# Patient Record
Sex: Female | Born: 1980 | Race: Black or African American | Hispanic: No | Marital: Single | Smoking: Never smoker
Health system: Southern US, Community
[De-identification: ages and names within clinical notes are randomized; demographics above are authoritative.]

## PROBLEM LIST (undated history)

## (undated) DIAGNOSIS — J45909 Unspecified asthma, uncomplicated: Secondary | ICD-10-CM

## (undated) DIAGNOSIS — Z8249 Family history of ischemic heart disease and other diseases of the circulatory system: Secondary | ICD-10-CM

## (undated) DIAGNOSIS — D649 Anemia, unspecified: Secondary | ICD-10-CM

## (undated) DIAGNOSIS — Z833 Family history of diabetes mellitus: Secondary | ICD-10-CM

## (undated) DIAGNOSIS — Z87898 Personal history of other specified conditions: Secondary | ICD-10-CM

## (undated) HISTORY — PX: NO PAST SURGERIES: SHX2092

## (undated) HISTORY — DX: Family history of diabetes mellitus: Z83.3

## (undated) HISTORY — DX: Personal history of other specified conditions: Z87.898

## (undated) HISTORY — DX: Family history of ischemic heart disease and other diseases of the circulatory system: Z82.49

## (undated) HISTORY — DX: Unspecified asthma, uncomplicated: J45.909

---

## 2004-04-12 ENCOUNTER — Emergency Department (HOSPITAL_COMMUNITY): Admission: EM | Admit: 2004-04-12 | Discharge: 2004-04-12 | Payer: Self-pay | Admitting: Emergency Medicine

## 2012-01-26 HISTORY — PX: TRANSTHORACIC ECHOCARDIOGRAM: SHX275

## 2013-03-13 ENCOUNTER — Encounter: Payer: Self-pay | Admitting: Internal Medicine

## 2013-03-13 ENCOUNTER — Encounter: Payer: Self-pay | Admitting: *Deleted

## 2013-03-17 ENCOUNTER — Ambulatory Visit: Payer: Self-pay | Admitting: Internal Medicine

## 2013-03-31 ENCOUNTER — Ambulatory Visit (INDEPENDENT_AMBULATORY_CARE_PROVIDER_SITE_OTHER): Payer: BC Managed Care – PPO | Admitting: Internal Medicine

## 2013-03-31 ENCOUNTER — Encounter: Payer: Self-pay | Admitting: Internal Medicine

## 2013-03-31 VITALS — BP 120/60 | HR 74 | Ht 63.0 in | Wt 114.9 lb

## 2013-03-31 DIAGNOSIS — I493 Ventricular premature depolarization: Secondary | ICD-10-CM | POA: Insufficient documentation

## 2013-03-31 DIAGNOSIS — I4949 Other premature depolarization: Secondary | ICD-10-CM

## 2013-03-31 DIAGNOSIS — R002 Palpitations: Secondary | ICD-10-CM | POA: Insufficient documentation

## 2013-03-31 NOTE — Progress Notes (Signed)
OFFICE NOTE  Chief Complaint:  No complaints  Primary Care Physician: No PCP Per Patient  HPI:  Paige Barber a pleasant 32 year old female who was previously followed by Dr. Alanda Amass. She's here today to establish cardiac care with me. Her past medical history significant for palpitations at which time she was having PVCs. She reports she has had no further palpitations at about 2 years. She followed up with Dr. Alanda Amass periodically and he performed an echocardiogram in 2013. This showed normal systolic and diastolic function with mild left atrial enlargement and no significant valvular abnormalities. There was no significant valvular prolapse. She continues to be physically active and goes to the gym several times a week. She is not a caffeine user. She does have occasional seasonal allergies and was counseled about not using any antihistamines with decongestants.  PMHx:  Past Medical History  Diagnosis Date  . History of palpitations   . Asthma   . Family history of hypertension   . Family history of diabetes mellitus (DM)     Past Surgical History  Procedure Laterality Date  . Transthoracic echocardiogram  01/2012    EF=>55%; LA mildly dilated; mitral valve leaflets thickened, borderline MVP, mild MR; trace TR with normal RVSP; AV mildly sclerotic    FAMHx:  Family History  Problem Relation Age of Onset  . Diabetes Father     SOCHx:   reports that she has never smoked. She has never used smokeless tobacco. She reports that she drinks alcohol. Her drug history is not on file.  ALLERGIES:  No Known Allergies  ROS: A comprehensive review of systems was negative.  HOME MEDS: Current Outpatient Prescriptions  Medication Sig Dispense Refill  . Acetaminophen (TYLENOL PO) Take by mouth as needed.      Marland Kitchen albuterol (PROVENTIL HFA;VENTOLIN HFA) 108 (90 BASE) MCG/ACT inhaler Inhale 1 puff into the lungs every 6 (six) hours as needed for wheezing or shortness of  breath.      . Cholecalciferol (VITAMIN D-3 PO) Take by mouth daily.      . IRON PO Take by mouth daily.      Colleen Can FE 1/20 1-20 MG-MCG tablet Take 1 tablet by mouth daily.      . Multiple Vitamin (MULTIVITAMIN) capsule Take 1 capsule by mouth daily.      . NON FORMULARY every 30 (thirty) days. Allergy Injection       No current facility-administered medications for this visit.    LABS/IMAGING: No results found for this or any previous visit (from the past 48 hour(s)). No results found.  VITALS: BP 120/60  Pulse 74  Ht 5\' 3"  (1.6 m)  Wt 114 lb 14.4 oz (52.118 kg)  BMI 20.36 kg/m2  EXAM: General appearance: alert and no distress Neck: no carotid bruit and no JVD Lungs: clear to auscultation bilaterally Heart: regular rate and rhythm, S1, S2 normal, no murmur, click, rub or gallop Extremities: extremities normal, atraumatic, no cyanosis or edema Psych: pleasant, norma'  EKG: Normal sinus rhythm at 74, left anterior fascicular block  ASSESSMENT: 1. Palpitations - resolved 2. History of PVCs  PLAN: 1.   [name] has a history of palpitations and PVCs but has not had symptoms in about 2 years. She continues to exercise and takes general weak good care of herself. Her weight is appropriate. I would recommend that she should followup with me as needed.  Chrystie Nose, MD, [facility] Attending Cardiologist [facility]  Lamaria Hildebrandt C 03/31/2013, 3:17  PM

## 2013-03-31 NOTE — Patient Instructions (Signed)
Your physician recommends that you schedule a follow-up appointment as needed  

## 2013-12-28 ENCOUNTER — Encounter: Payer: Self-pay | Admitting: Podiatry

## 2013-12-28 ENCOUNTER — Ambulatory Visit (INDEPENDENT_AMBULATORY_CARE_PROVIDER_SITE_OTHER): Payer: BC Managed Care – PPO | Admitting: Podiatry

## 2013-12-28 VITALS — BP 118/79 | HR 77 | Resp 16

## 2013-12-28 DIAGNOSIS — L600 Ingrowing nail: Secondary | ICD-10-CM

## 2013-12-28 NOTE — Progress Notes (Signed)
Subjective:     Patient ID: Paige Barber, female   DOB: 07-18-1980, 33 y.o.   MRN: [id number]  Toe Pain    patient points to right big toe lateral border stating that it's been bothering her and now becoming discolored and can be sore at different times for the last few weeks   Review of Systems  All other systems reviewed and are negative.      Objective:   Physical Exam  Nursing note and vitals reviewed. Constitutional: She is oriented to person, place, and time.  Cardiovascular: Intact distal pulses.   Musculoskeletal: Normal range of motion.  Neurological: She is oriented to person, place, and time.  Skin: Skin is warm.   neurovascular status found to be intact with muscle strength adequate and range of motion of the subtalar and midtarsal joint within normal limits. Patient's found to have no equinus condition good digital perfusion and is well oriented x3. Right hallux lateral border is incurvated and sore when pressed with slight discoloration of the skin secondary to pressure from the nailbed     Assessment:     Ingrown toenail deformity right hallux lateral border with pain    Plan:     H&P and condition discussed. I do think it is an ingrown toenail and I recommended correction and explained the procedure and risk. Patient wants to do this procedure and today I infiltrated 60 mg Xylocaine Marcaine mixture remove the lateral border exposed matrix and applied phenol 3 applications 30 seconds followed by alcohol lavaged and sterile dressing. Reappoint her recheck

## 2013-12-28 NOTE — Progress Notes (Signed)
   Subjective:    Patient ID: Paige Barber, female    DOB: 1980-12-21, 33 y.o.   MRN: [id number]  HPI Comments: "I have a toe that looks discolored on the sode"  Patient c/o discomfort 1st toe right, lateral border, for a few weeks. She states that the skin along that side of anil is hard and dark. No home treatment.  Foot Pain      Review of Systems  Allergic/Immunologic: Positive for environmental allergies.  All other systems reviewed and are negative.      Objective:   Physical Exam        Assessment & Plan:

## 2013-12-28 NOTE — Patient Instructions (Signed)

## 2014-05-02 ENCOUNTER — Other Ambulatory Visit: Payer: Self-pay | Admitting: Obstetrics and Gynecology

## 2014-05-02 DIAGNOSIS — D252 Subserosal leiomyoma of uterus: Secondary | ICD-10-CM

## 2014-05-02 DIAGNOSIS — N9489 Other specified conditions associated with female genital organs and menstrual cycle: Secondary | ICD-10-CM

## 2014-05-10 ENCOUNTER — Other Ambulatory Visit: Payer: Self-pay

## 2014-05-16 ENCOUNTER — Ambulatory Visit
Admission: RE | Admit: 2014-05-16 | Discharge: 2014-05-16 | Disposition: A | Discharge status: Home / Self Care | Payer: PRIVATE HEALTH INSURANCE | Source: Ambulatory Visit | Attending: Obstetrics and Gynecology | Admitting: Obstetrics and Gynecology

## 2014-05-16 DIAGNOSIS — N9489 Other specified conditions associated with female genital organs and menstrual cycle: Secondary | ICD-10-CM

## 2014-05-16 DIAGNOSIS — D252 Subserosal leiomyoma of uterus: Secondary | ICD-10-CM

## 2014-05-16 MED ORDER — GADOBENATE DIMEGLUMINE 529 MG/ML IV SOLN
10.0000 mL | Freq: Once | INTRAVENOUS | Status: AC | PRN
  Administered 2014-05-16: 10 mL via INTRAVENOUS

## 2016-01-06 IMAGING — MR MR PELVIS WO/W CM
7 of 10 series · 31 of 48 positions shown · IV contrast (10cc multihance)
Comparison: None.

CLINICAL DATA: Fibroids.  Left adnexal mass.

EXAM:
MRI PELVIS WITHOUT AND WITH CONTRAST
TECHNIQUE: Multiplanar multisequence MR imaging of the pelvis was performed
both before and after administration of intravenous contrast.
CONTRAST:  10mL MULTIHANCE GADOBENATE DIMEGLUMINE 529 MG/ML IV SOLN

[Series 2: cor haste · coronal · 6.0mm · 0.74mm/px · 4 of 22 slices shown]
[im 1/22]
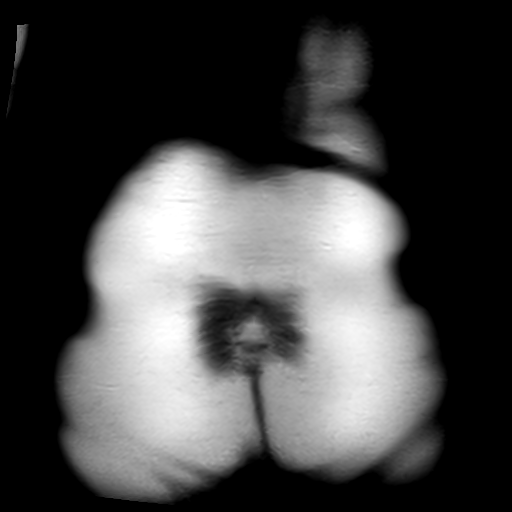
[im 8/22]
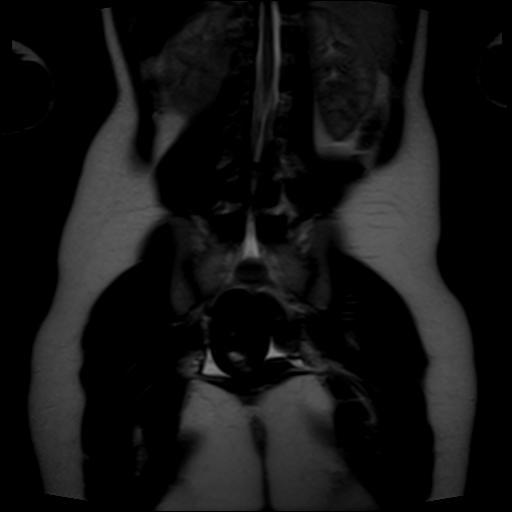
[im 15/22]
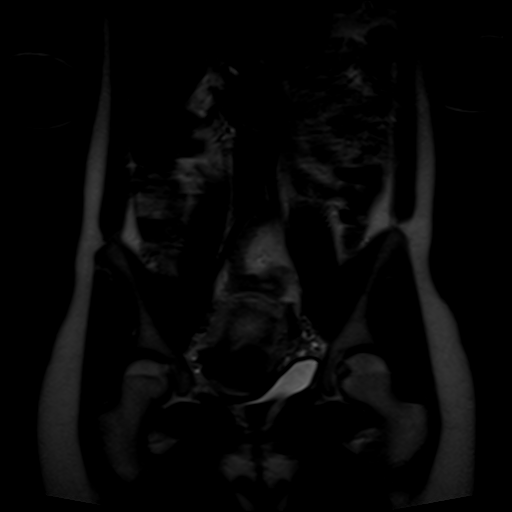
[im 22/22]
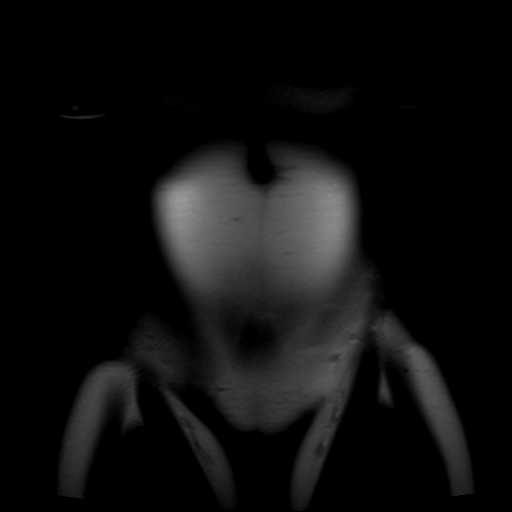

[Series 3: t2_tse_sag · sagittal · 5.0mm · 0.98mm/px · 5 of 27 slices shown]
[im 1/27]
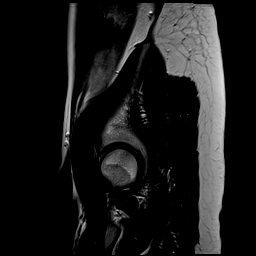
[im 7/27]
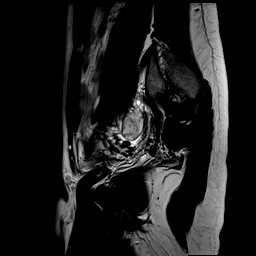
[im 14/27]
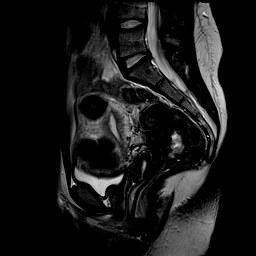
[im 20/27]
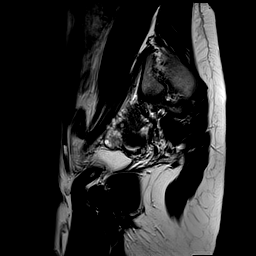
[im 27/27]
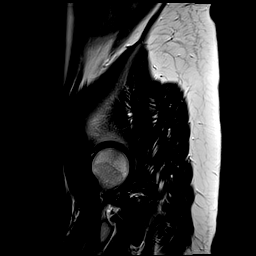

[Series 4: t2_tse axial · axial · 7.0mm · 0.98mm/px · z∈[-114,+68]mm · 4 of 21 slices shown]
[im 1/21]
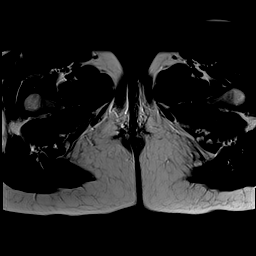
[im 7/21]
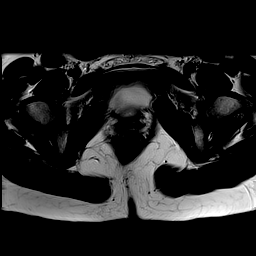
[im 14/21]
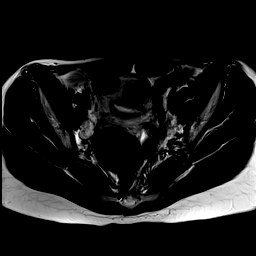
[im 21/21]
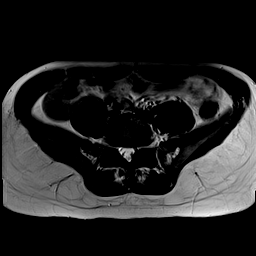

[Series 5: t2_tse axial fs · axial · 7.0mm · 0.98mm/px · z∈[-114,+68]mm · 4 of 21 slices shown]
[im 1/21]
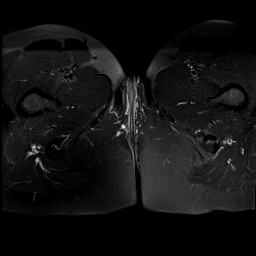
[im 7/21]
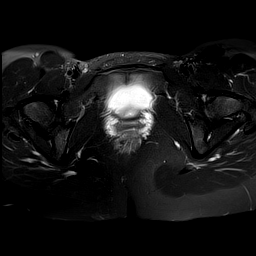
[im 14/21]
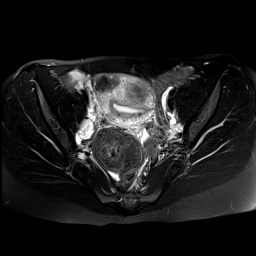
[im 21/21]
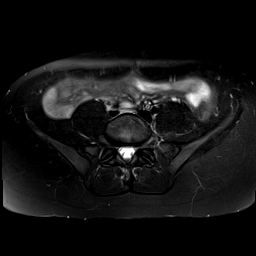

[Series 6: axial spgr · axial · 7.0mm · 0.98mm/px · z∈[-123,+77]mm · 5 of 23 slices shown]
[im 1/23]
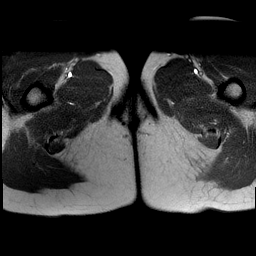
[im 6/23]
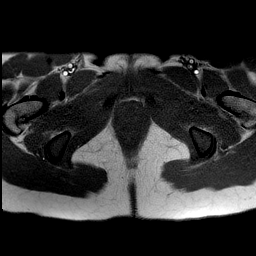
[im 12/23]
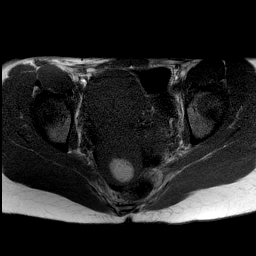
[im 17/23]
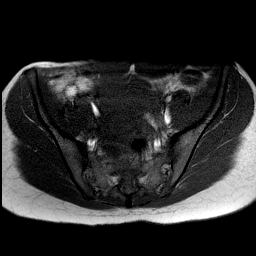
[im 23/23]
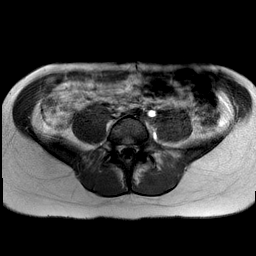

[Series 7: axial spgr pre · axial · non-contrast · 7.0mm · 0.49mm/px · z∈[-123,+77]mm · 5 of 23 slices shown]
[im 1/23]
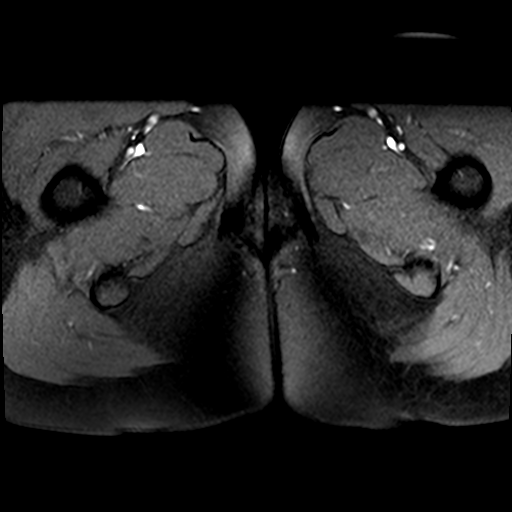
[im 6/23]
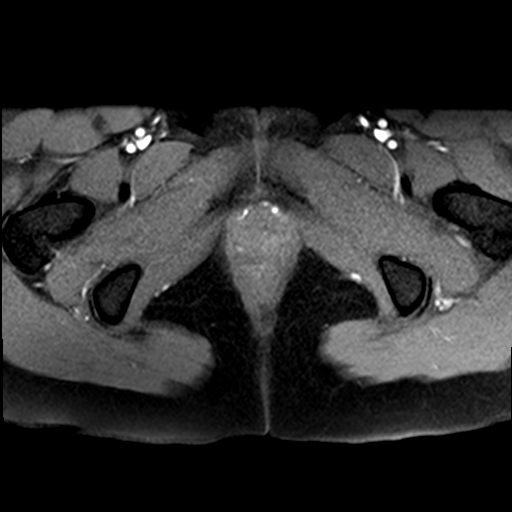
[im 12/23]
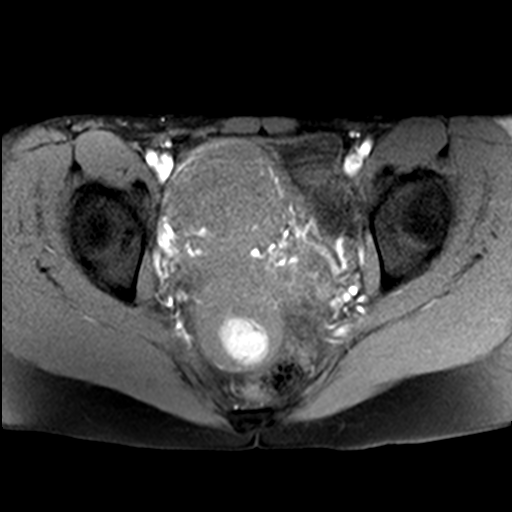
[im 17/23]
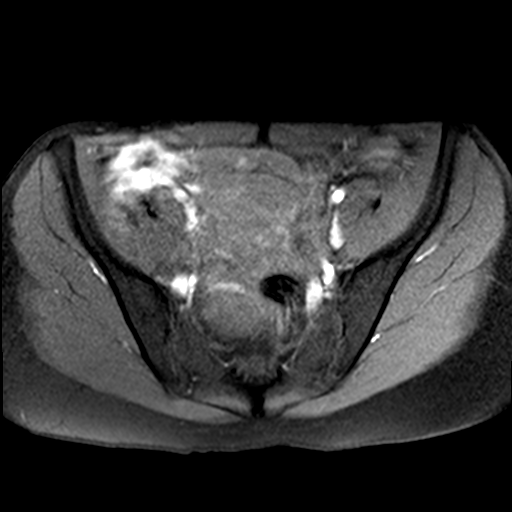
[im 23/23]
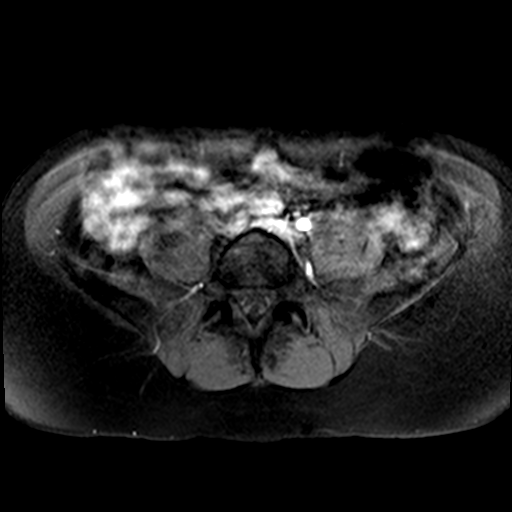

[Series 8: axial spgr post · axial · 7.0mm · 0.49mm/px · z∈[-123,+22]mm · 4 of 23 slices shown]
[im 1/23]
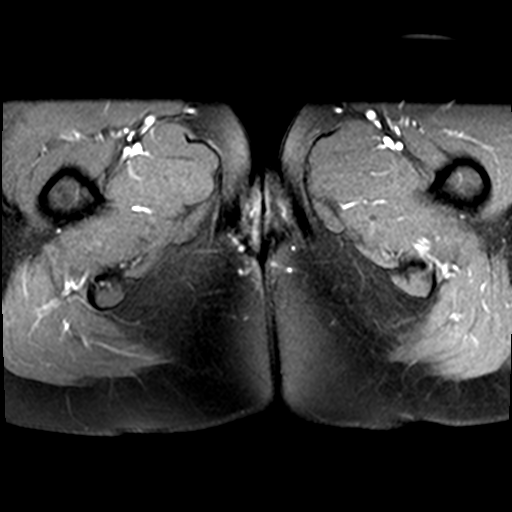
[im 6/23]
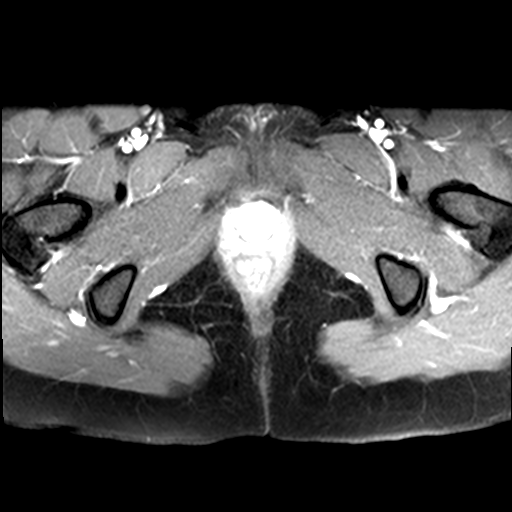
[im 12/23]
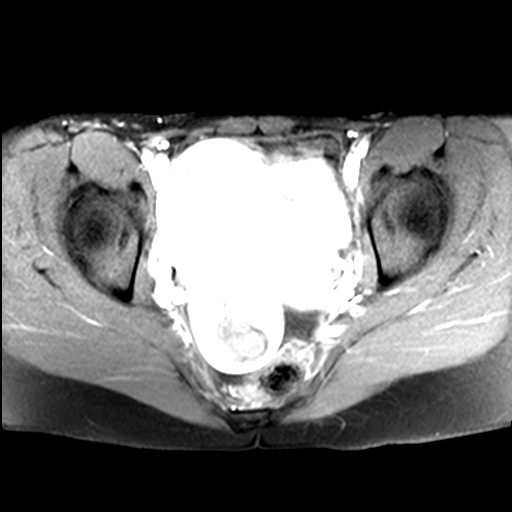
[im 17/23]
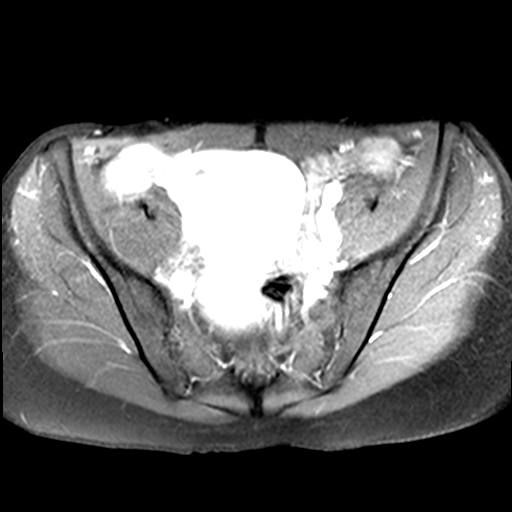

[31 of 48 positions shown; findings below may reference images not displayed]

FINDINGS: Uterus:  Measures 10.7 x 10.9 x 9.4 cm.   Estimated volume = 573 cc

Fibroids: Several intramural fibroids are seen. These are located in
the anterior corpus measuring 5.2 cm, and in the anterior fundus
measuring 2.3 cm and 2.8 cm in maximum diameter. These fibroids all
show diffuse contrast enhancement.

A pedunculated subserosal fibroid is seen arising from the posterior
corpus which extends into the cul-de-sac, and measures 7.1 cm. This
fibroid has a broad-based attachment measuring approximately 5 cm in
diameter. This fibroid shows contrast enhancement with central areas
of cystic degeneration.

A second pedunculated subserosal fibroid is seen extending into the
left adnexa. This fibroid measures 4.6 cm and has a relatively
broad-based attachment measuring approximately 3 cm in diameter.
This fibroid shows contrast enhancement with central areas of cystic
degeneration.

Intracavitary fibroids:  None.

Cervix:  Normal appearance.

Right ovary:  Normal appearance.  No adnexal mass identified.

Left ovary:  Normal appearance.  No adnexal mass identified.

Free fluid:  Small amount in right adnexa and cul-de-sac.

Other: No other pelvic masses, lymphadenopathy, or inflammatory
process identified.
IMPRESSION: Uterine fibroids as described above, with 2 pedunculated subserosal
fibroids extending into the left adnexa and pelvic cul-de-sac. No
intracavitary fibroids identified.

Normal appearance of both ovaries.

## 2016-09-10 ENCOUNTER — Encounter (HOSPITAL_COMMUNITY): Payer: Self-pay | Admitting: Emergency Medicine

## 2016-09-10 ENCOUNTER — Emergency Department (HOSPITAL_COMMUNITY)
Admission: EM | Admit: 2016-09-10 | Discharge: 2016-09-11 | Disposition: A | Discharge status: Home / Self Care | Payer: 59 | Attending: Emergency Medicine | Admitting: Emergency Medicine

## 2016-09-10 DIAGNOSIS — Y999 Unspecified external cause status: Secondary | ICD-10-CM | POA: Diagnosis not present

## 2016-09-10 DIAGNOSIS — Z79899 Other long term (current) drug therapy: Secondary | ICD-10-CM | POA: Insufficient documentation

## 2016-09-10 DIAGNOSIS — S83005A Unspecified dislocation of left patella, initial encounter: Secondary | ICD-10-CM | POA: Diagnosis not present

## 2016-09-10 DIAGNOSIS — X501XXA Overexertion from prolonged static or awkward postures, initial encounter: Secondary | ICD-10-CM | POA: Diagnosis not present

## 2016-09-10 DIAGNOSIS — Y939 Activity, unspecified: Secondary | ICD-10-CM | POA: Insufficient documentation

## 2016-09-10 DIAGNOSIS — S8992XA Unspecified injury of left lower leg, initial encounter: Secondary | ICD-10-CM | POA: Diagnosis present

## 2016-09-10 DIAGNOSIS — Y929 Unspecified place or not applicable: Secondary | ICD-10-CM | POA: Insufficient documentation

## 2016-09-10 DIAGNOSIS — J45909 Unspecified asthma, uncomplicated: Secondary | ICD-10-CM | POA: Diagnosis not present

## 2016-09-10 NOTE — ED Notes (Signed)
EDP immediately at bedside reducing L knee.

## 2016-09-10 NOTE — ED Triage Notes (Signed)
Pt was stepping out of bed and fell landing on L knee. Obvious deformity noted. Pulses present.

## 2016-09-10 NOTE — Discharge Instructions (Signed)
Apply ice as needed. Take ibuprofen or naproxen as needed. Wear the knee immobilizer, use crutches as needed.

## 2016-09-10 NOTE — ED Notes (Signed)
Ortho tech called for knee immobilizer and crutches

## 2016-09-10 NOTE — ED Provider Notes (Signed)
[facility] DEPT Provider Note   CSN: [id number] Arrival date & time: 09/10/16  2314  By signing my name below, I, Dolores Hoose, attest that this documentation has been prepared under the direction and in the presence of Delora Fuel, MD . Electronically Signed: Dolores Hoose, Scribe. 09/10/2016. 11:17 PM.  History   Chief Complaint Chief Complaint  Patient presents with  . Knee Injury   The history is provided by the patient. No language interpreter was used.    HPI Comments:  Paige Barber is a 36 y.o. female who presents to the Emergency Department complaining of sudden-onset, constant, severe left knee pain onset just PTA. Pt states that she tried to get out of bed when she twisted her knee and dislocated her kneecap and fell to the ground. She notes an obvious deformity to the area. No medications tried PTA. Denies any head injury during her fall.   Past Medical History:  Diagnosis Date  . Asthma   . Family history of diabetes mellitus (DM)   . Family history of hypertension   . History of palpitations     Patient Active Problem List   Diagnosis Date Noted  . PVC's (premature ventricular contractions) 03/31/2013  . Palpitations 03/31/2013    Past Surgical History:  Procedure Laterality Date  . TRANSTHORACIC ECHOCARDIOGRAM  01/2012   EF=>55%; LA mildly dilated; mitral valve leaflets thickened, borderline MVP, mild MR; trace TR with normal RVSP; AV mildly sclerotic    OB History    No data available       Home Medications    Prior to Admission medications   Medication Sig Start Date End Date Taking? Authorizing Provider  Acetaminophen (TYLENOL PO) Take by mouth as needed.    [provider]  albuterol (PROVENTIL HFA;VENTOLIN HFA) 108 (90 BASE) MCG/ACT inhaler Inhale 1 puff into the lungs every 6 (six) hours as needed for wheezing or shortness of breath.    [provider]  Cholecalciferol (VITAMIN D-3 PO) Take by mouth daily.    [provider]  IRON PO Take by mouth daily.    [provider]  JUNEL FE 1/20 1-20 MG-MCG tablet Take 1 tablet by mouth daily. 03/13/13   [provider]  Multiple Vitamin (MULTIVITAMIN) capsule Take 1 capsule by mouth daily.    [provider]  NON FORMULARY every 30 (thirty) days. Allergy Injection    [provider]    Family History Family History  Problem Relation Age of Onset  . Diabetes Father     Social History Social History  Substance Use Topics  . Smoking status: Never Smoker  . Smokeless tobacco: Never Used  . Alcohol use Yes     Comment: rarely     Allergies   Patient has no known allergies.   Review of Systems Review of Systems  Musculoskeletal: Positive for arthralgias.       Positive for Deformity   Neurological: Negative for headaches.  All other systems reviewed and are negative.    Physical Exam Updated Vital Signs BP (!) 153/88 (BP Location: Right Arm)   Pulse 76   Temp 98.1 F (36.7 C) (Oral)   Resp (!) 22   Ht 5\' 3"  (1.6 m)   Wt 110 lb (49.9 kg)   SpO2 100%   BMI 19.49 kg/m   Physical Exam  Constitutional: She is oriented to person, place, and time. She appears well-developed and well-nourished.  Appears uncomfortable   HENT:  Head: Normocephalic and atraumatic.  Eyes: EOM are normal. Pupils are equal, round, and reactive to light.  Neck: Normal range of motion. Neck supple. No JVD present.  Cardiovascular: Normal rate, regular rhythm and normal heart sounds.   No murmur heard. Pulmonary/Chest: Effort normal and breath sounds normal. She has no wheezes. She has no rales. She exhibits no tenderness.  Abdominal: Soft. Bowel sounds are normal. She exhibits no distension and no mass. There is no tenderness.  Musculoskeletal: Normal range of motion. She exhibits tenderness and deformity. She exhibits no edema.  Deformity of the left knee with patella dislocated laterally.   Lymphadenopathy:    She  has no cervical adenopathy.  Neurological: She is alert and oriented to person, place, and time. No cranial nerve deficit. She exhibits normal muscle tone. Coordination normal.  Skin: Skin is warm and dry. No rash noted.  Psychiatric: She has a normal mood and affect. Her behavior is normal. Judgment and thought content normal.  Nursing note and vitals reviewed.    ED Treatments / Results  DIAGNOSTIC STUDIES:  Oxygen Saturation is 100% on RA, normal by my interpretation.    COORDINATION OF CARE:  11:19 PM Reduced dislocation with no complications.    Procedures Reduction of dislocation Date/Time: 09/10/2016 11:21 PM Performed by: Roxanne Mins Estreya Clay Authorized by: Roxanne Mins, Jaliyah Fotheringham  Consent: Verbal consent obtained. Risks and benefits: risks, benefits and alternatives were discussed Consent given by: patient Patient understanding: patient states understanding of the procedure being performed Patient consent: the patient's understanding of the procedure matches consent given Procedure consent: procedure consent matches procedure scheduled Relevant documents: relevant documents present and verified Patient identity confirmed: verbally with patient Local anesthesia used: no  Anesthesia: Local anesthesia used: no  Sedation: Patient sedated: no Patient tolerance: Patient tolerated the procedure well with no immediate complications Comments: Dislocation reduced by extension of the left knee and a gentle manipulation of the patella. She is placed in a knee immobilizer and given crutches.    (including critical care time)  Medications Ordered in ED Medications - No data to display   Initial Impression / Assessment and Plan / ED Course  I have reviewed the triage vital signs and the nursing notes.  Closed dislocation of the left patella successfully reduced in the emergency department. No indication for imaging. She is placed in a knee immobilizer and given crutches to use as needed.  Referred to orthopedics for follow-up.  Final Clinical Impressions(s) / ED Diagnoses   Final diagnoses:  Closed dislocation of left patella, initial encounter    New Prescriptions New Prescriptions   No medications on file   I personally performed the services described in this documentation, which was scribed in my presence. The recorded information has been reviewed and is accurate.       Delora Fuel, MD 99/37/16 [phone]

## 2016-09-11 NOTE — ED Notes (Signed)
PT states understanding of care given, follow up care. PT ambulated from ED to car with a steady gait.  

## 2019-07-01 ENCOUNTER — Ambulatory Visit: Payer: PRIVATE HEALTH INSURANCE | Attending: Internal Medicine

## 2019-07-01 DIAGNOSIS — Z23 Encounter for immunization: Secondary | ICD-10-CM

## 2019-07-01 NOTE — Progress Notes (Signed)
   Covid-19 Vaccination Clinic  Name:  Paige Barber    MRN: [id number] DOB: 08-24-1980  07/01/2019  [name] was observed post Covid-19 immunization for 15 minutes without incident. She was provided with Vaccine Information Sheet and instruction to access the V-Safe system.   [name] was instructed to call 911 with any severe reactions post vaccine: Marland Kitchen Difficulty breathing  . Swelling of face and throat  . A fast heartbeat  . A bad rash all over body  . Dizziness and weakness   Immunizations Administered    Name Date Dose VIS Date Route   Pfizer COVID-19 Vaccine 07/01/2019  8:23 AM 0.3 mL 04/07/2019 Intramuscular   Manufacturer: [place]   Lot: [id number]   [place]: [id number]

## 2019-07-22 ENCOUNTER — Ambulatory Visit: Payer: PRIVATE HEALTH INSURANCE | Attending: Internal Medicine

## 2019-07-22 DIAGNOSIS — Z23 Encounter for immunization: Secondary | ICD-10-CM

## 2019-07-22 NOTE — Progress Notes (Signed)
   Covid-19 Vaccination Clinic  Name:  Clio Falana    MRN: [id number] DOB: March 21, 1981  07/22/2019  Ms. Seifert was observed post Covid-19 immunization for 15 minutes without incident. She was provided with Vaccine Information Sheet and instruction to access the V-Safe system.   Ms. Aronson was instructed to call 911 with any severe reactions post vaccine: Marland Kitchen Difficulty breathing  . Swelling of face and throat  . A fast heartbeat  . A bad rash all over body  . Dizziness and weakness   Immunizations Administered    Name Date Dose VIS Date Route   Pfizer COVID-19 Vaccine 07/22/2019  8:43 AM 0.3 mL 04/07/2019 Intramuscular   Manufacturer: [place]   Lot: [id number]   [place]: [id number]

## 2019-09-27 ENCOUNTER — Other Ambulatory Visit: Payer: Self-pay | Admitting: Obstetrics and Gynecology

## 2019-10-03 ENCOUNTER — Other Ambulatory Visit (HOSPITAL_COMMUNITY)
Admission: RE | Admit: 2019-10-03 | Discharge: 2019-10-03 | Disposition: A | Discharge status: Home / Self Care | Payer: PRIVATE HEALTH INSURANCE | Source: Ambulatory Visit | Attending: Obstetrics and Gynecology | Admitting: Obstetrics and Gynecology

## 2019-10-03 DIAGNOSIS — Z01812 Encounter for preprocedural laboratory examination: Secondary | ICD-10-CM | POA: Insufficient documentation

## 2019-10-03 DIAGNOSIS — Z20822 Contact with and (suspected) exposure to covid-19: Secondary | ICD-10-CM | POA: Diagnosis not present

## 2019-10-03 LAB — SARS CORONAVIRUS 2 (TAT 6-24 HRS): SARS Coronavirus 2: NEGATIVE

## 2019-10-03 NOTE — Progress Notes (Signed)
[id number] [address], [place] [address] Phone: [phone] Fax: [phone]  [id number] - [place], [place] - [address] [place] [address] Phone: [phone] Fax: [phone]    Your procedure is scheduled on Friday, June 11th.  Report to [facility] Main Entrance "A" at 9:00 A.M., and check in at the Admitting office.  Call this number if you have problems the morning of surgery:  [phone]  Call [phone] if you have any questions prior to your surgery date Monday-Friday 8am-4pm   Remember:  Do not eat or drink after midnight the night before your surgery    Take these medicines the morning of surgery with A SIP OF WATER  IF NEEDED - acetaminophen (TYLENOL), loratadine (CLARITIN), triamcinolone (NASACORT ALLERGY 24HR)/nasal spray     albuterol (PROVENTIL HFA;VENTOLIN HFA) - bring with you the day of surgery  As of today, STOP taking any Aspirin (unless otherwise instructed by your surgeon) and Aspirin containing products, Aleve, Naproxen, Ibuprofen, Motrin, Advil, Goody's, BC's, all herbal medications, fish oil, and all vitamins.                     Do not wear jewelry, make up, or nail polish            Do not wear lotions, powders, perfumes, or deodorant.            Do not shave 48 hours prior to surgery.              Do not bring valuables to the hospital.            [facility] is not responsible for any belongings or valuables.  Do NOT Smoke (Tobacco/Vapping) or drink Alcohol 24 hours prior to your procedure If you use a CPAP at night, you may bring all equipment for your overnight stay.   Contacts, glasses, dentures or bridgework may not be worn into surgery.      For patients admitted to the hospital, discharge time will be determined by your treatment team.   Patients discharged the day of surgery will not be allowed to drive home, and someone  needs to stay with them for 24 hours.  Special instructions:   [place]- Preparing For Surgery  Before surgery, you can play an important role. Because skin is not sterile, your skin needs to be as free of germs as possible. You can reduce the number of germs on your skin by washing with CHG (chlorahexidine gluconate) Soap before surgery.  CHG is an antiseptic cleaner which kills germs and bonds with the skin to continue killing germs even after washing.    Oral Hygiene is also important to reduce your risk of infection.  Remember - BRUSH YOUR TEETH THE MORNING OF SURGERY WITH YOUR REGULAR TOOTHPASTE  Please do not use if you have an allergy to CHG or antibacterial soaps. If your skin becomes reddened/irritated stop using the CHG.  Do not shave (including legs and underarms) for at least 48 hours prior to first CHG shower. It is OK to shave your face.  Please follow these instructions carefully.   1. Shower the NIGHT BEFORE SURGERY and the MORNING OF SURGERY with CHG Soap.   2. If you chose to wash your hair, wash your hair first as usual with your normal shampoo.  3. After you shampoo, rinse your hair and body thoroughly to  remove the shampoo.  4. Use CHG as you would any other liquid soap. You can apply CHG directly to the skin and wash gently with a scrungie or a clean washcloth.   5. Apply the CHG Soap to your body ONLY FROM THE NECK DOWN.  Do not use on open wounds or open sores. Avoid contact with your eyes, ears, mouth and genitals (private parts). Wash Face and genitals (private parts)  with your normal soap.   6. Wash thoroughly, paying special attention to the area where your surgery will be performed.  7. Thoroughly rinse your body with warm water from the neck down.  8. DO NOT shower/wash with your normal soap after using and rinsing off the CHG Soap.  9. Pat yourself dry with a CLEAN TOWEL.  10. Wear CLEAN PAJAMAS to bed the night before surgery, wear comfortable  clothes the morning of surgery  11. Place CLEAN SHEETS on your bed the night of your first shower and DO NOT SLEEP WITH PETS.  Day of Surgery: Shower with CHG soap as instructed above.  Do not apply any deodorants/lotions.  Please wear clean clothes to the hospital/surgery center.   Remember to brush your teeth WITH YOUR REGULAR TOOTHPASTE.   Please read over the following fact sheets that you were given.

## 2019-10-04 ENCOUNTER — Encounter (HOSPITAL_COMMUNITY): Payer: Self-pay

## 2019-10-04 ENCOUNTER — Encounter (HOSPITAL_COMMUNITY)
Admission: RE | Admit: 2019-10-04 | Discharge: 2019-10-04 | Disposition: A | Discharge status: Home / Self Care | Payer: PRIVATE HEALTH INSURANCE | Source: Ambulatory Visit | Attending: Obstetrics and Gynecology | Admitting: Obstetrics and Gynecology

## 2019-10-04 ENCOUNTER — Other Ambulatory Visit: Payer: Self-pay

## 2019-10-04 DIAGNOSIS — Z01812 Encounter for preprocedural laboratory examination: Secondary | ICD-10-CM | POA: Insufficient documentation

## 2019-10-04 HISTORY — DX: Anemia, unspecified: D64.9

## 2019-10-04 LAB — CBC
HCT: 39.6 % (ref 36.0–46.0)
Hemoglobin: 12.4 g/dL (ref 12.0–15.0)
MCH: 27.7 pg (ref 26.0–34.0)
MCHC: 31.3 g/dL (ref 30.0–36.0)
MCV: 88.6 fL (ref 80.0–100.0)
Platelets: 231 10*3/uL (ref 150–400)
RBC: 4.47 MIL/uL (ref 3.87–5.11)
RDW: 15.7 % — ABNORMAL HIGH (ref 11.5–15.5)
WBC: 3.3 10*3/uL — ABNORMAL LOW (ref 4.0–10.5)
nRBC: 0 % (ref 0.0–0.2)

## 2019-10-04 NOTE — Progress Notes (Addendum)
PCP - Denies  Cardiologist - Denies  Chest x-ray - Denies  EKG - Denies  Stress Test - Denies  ECHO - 2013 (E)- Pt had palpitations due to stress. Pt sts she has not had any other episodes in 4 years.  Cardiac Cath - Denies  AICD-na PM-na LOOP-na  Sleep Study - Denies CPAP - Denies  LABS- 10/04/19: CBC 10/03/19: COVID- Neg 10/06/19: T/S  ASA- Denies  ERAS- No  HA1C- Denies  Anesthesia- No  Pt denies having chest pain, sob, or fever at this time. All instructions explained to the pt, with a verbal understanding of the material. Pt agrees to go over the instructions while at home for a better understanding. Pt also instructed to self quarantine after being tested for COVID-19. The opportunity to ask questions was provided.   Coronavirus Screening  Have you experienced the following symptoms:  Cough yes/no: No Fever (>100.63F)  yes/no: No Runny nose yes/no: No Sore throat yes/no: No Difficulty breathing/shortness of breath  yes/no: No  Have you or a family member traveled in the last 14 days and where? yes/no: No   If the patient indicates "YES" to the above questions, their PAT will be rescheduled to limit the exposure to others and, the surgeon will be notified. THE PATIENT WILL NEED TO BE ASYMPTOMATIC FOR 14 DAYS.   If the patient is not experiencing any of these symptoms, the PAT nurse will instruct them to NOT bring anyone with them to their appointment since they may have these symptoms or traveled as well.   Please remind your patients and families that hospital visitation restrictions are in effect and the importance of the restrictions.

## 2019-10-06 ENCOUNTER — Inpatient Hospital Stay (HOSPITAL_COMMUNITY)
Admission: RE | Admit: 2019-10-06 | Discharge: 2019-10-07 | DRG: 742 | Disposition: A | Discharge status: Home / Self Care | Payer: BC Managed Care – PPO | Attending: Obstetrics and Gynecology | Admitting: Obstetrics and Gynecology

## 2019-10-06 ENCOUNTER — Inpatient Hospital Stay (HOSPITAL_COMMUNITY): Payer: BC Managed Care – PPO | Admitting: Anesthesiology

## 2019-10-06 ENCOUNTER — Encounter (HOSPITAL_COMMUNITY): Payer: Self-pay | Admitting: Obstetrics and Gynecology

## 2019-10-06 ENCOUNTER — Encounter (HOSPITAL_COMMUNITY)
Admission: RE | Disposition: A | Discharge status: Home / Self Care | Payer: Self-pay | Source: Home / Self Care | Attending: Obstetrics and Gynecology

## 2019-10-06 ENCOUNTER — Other Ambulatory Visit: Payer: Self-pay

## 2019-10-06 DIAGNOSIS — D252 Subserosal leiomyoma of uterus: Secondary | ICD-10-CM | POA: Diagnosis present

## 2019-10-06 DIAGNOSIS — J8283 Eosinophilic asthma: Secondary | ICD-10-CM | POA: Diagnosis present

## 2019-10-06 DIAGNOSIS — Z79899 Other long term (current) drug therapy: Secondary | ICD-10-CM

## 2019-10-06 DIAGNOSIS — Z9889 Other specified postprocedural states: Secondary | ICD-10-CM

## 2019-10-06 DIAGNOSIS — D259 Leiomyoma of uterus, unspecified: Secondary | ICD-10-CM | POA: Diagnosis present

## 2019-10-06 DIAGNOSIS — Z7951 Long term (current) use of inhaled steroids: Secondary | ICD-10-CM | POA: Diagnosis not present

## 2019-10-06 HISTORY — PX: MYOMECTOMY: SHX85

## 2019-10-06 LAB — POCT PREGNANCY, URINE: Preg Test, Ur: NEGATIVE

## 2019-10-06 LAB — ABO/RH: ABO/RH(D): A POS

## 2019-10-06 LAB — TYPE AND SCREEN
ABO/RH(D): A POS
Antibody Screen: NEGATIVE

## 2019-10-06 SURGERY — MYOMECTOMY, ABDOMINAL APPROACH
Anesthesia: General

## 2019-10-06 MED ORDER — SILVER NITRATE-POT NITRATE 75-25 % EX MISC
CUTANEOUS | Status: AC
  Filled 2019-10-06: qty 10

## 2019-10-06 MED ORDER — SODIUM CHLORIDE 0.9% FLUSH: 9.0000 mL | INTRAVENOUS | Status: DC | PRN

## 2019-10-06 MED ORDER — BISACODYL 10 MG RE SUPP: 10.0000 mg | Freq: Every day | RECTAL | Status: DC | PRN

## 2019-10-06 MED ORDER — SCOPOLAMINE 1 MG/3DAYS TD PT72
MEDICATED_PATCH | TRANSDERMAL | Status: AC
  Administered 2019-10-06: 1.5 mg via TRANSDERMAL
  Filled 2019-10-06: qty 1

## 2019-10-06 MED ORDER — KETAMINE HCL 50 MG/5ML IJ SOSY
PREFILLED_SYRINGE | INTRAMUSCULAR | Status: AC
  Filled 2019-10-06: qty 10

## 2019-10-06 MED ORDER — PHENYLEPHRINE 40 MCG/ML (10ML) SYRINGE FOR IV PUSH (FOR BLOOD PRESSURE SUPPORT)
PREFILLED_SYRINGE | INTRAVENOUS | Status: AC
  Filled 2019-10-06: qty 10

## 2019-10-06 MED ORDER — HYDROMORPHONE 1 MG/ML IV SOLN
INTRAVENOUS | Status: AC
  Filled 2019-10-06: qty 30

## 2019-10-06 MED ORDER — DIPHENHYDRAMINE HCL 50 MG/ML IJ SOLN: 12.5000 mg | Freq: Four times a day (QID) | INTRAMUSCULAR | Status: DC | PRN

## 2019-10-06 MED ORDER — ACETAMINOPHEN 500 MG PO TABS
ORAL_TABLET | ORAL | Status: AC
  Administered 2019-10-06: 1000 mg via ORAL
  Filled 2019-10-06: qty 2

## 2019-10-06 MED ORDER — FENTANYL CITRATE (PF) 250 MCG/5ML IJ SOLN
INTRAMUSCULAR | Status: DC | PRN
  Administered 2019-10-06: 50 ug via INTRAVENOUS
  Administered 2019-10-06: 150 ug via INTRAVENOUS

## 2019-10-06 MED ORDER — ONDANSETRON HCL 4 MG/2ML IJ SOLN
INTRAMUSCULAR | Status: AC
  Filled 2019-10-06: qty 4

## 2019-10-06 MED ORDER — FENTANYL CITRATE (PF) 100 MCG/2ML IJ SOLN: 25.0000 ug | INTRAMUSCULAR | Status: DC | PRN

## 2019-10-06 MED ORDER — SODIUM CHLORIDE (PF) 0.9 % IJ SOLN
INTRAMUSCULAR | Status: AC
  Filled 2019-10-06: qty 100

## 2019-10-06 MED ORDER — NALOXONE HCL 0.4 MG/ML IJ SOLN: 0.4000 mg | INTRAMUSCULAR | Status: DC | PRN

## 2019-10-06 MED ORDER — VASOPRESSIN 20 UNIT/ML IV SOLN
INTRAVENOUS | Status: AC
  Filled 2019-10-06: qty 1

## 2019-10-06 MED ORDER — LIDOCAINE IN D5W 4-5 MG/ML-% IV SOLN
INTRAVENOUS | Status: DC | PRN
  Administered 2019-10-06 (×2): 25 ug/kg/min via INTRAVENOUS

## 2019-10-06 MED ORDER — IBUPROFEN 800 MG PO TABS: 800.0000 mg | ORAL_TABLET | Freq: Four times a day (QID) | ORAL | Status: DC

## 2019-10-06 MED ORDER — OXYCODONE HCL 5 MG PO TABS: 5.0000 mg | ORAL_TABLET | Freq: Once | ORAL | Status: DC | PRN

## 2019-10-06 MED ORDER — PHENYLEPHRINE 40 MCG/ML (10ML) SYRINGE FOR IV PUSH (FOR BLOOD PRESSURE SUPPORT)
PREFILLED_SYRINGE | INTRAVENOUS | Status: DC | PRN
  Administered 2019-10-06: 80 ug via INTRAVENOUS

## 2019-10-06 MED ORDER — OXYCODONE HCL 5 MG/5ML PO SOLN: 5.0000 mg | Freq: Once | ORAL | Status: DC | PRN

## 2019-10-06 MED ORDER — SUGAMMADEX SODIUM 200 MG/2ML IV SOLN
INTRAVENOUS | Status: DC | PRN
  Administered 2019-10-06: 125 mg via INTRAVENOUS

## 2019-10-06 MED ORDER — FENTANYL CITRATE (PF) 250 MCG/5ML IJ SOLN
INTRAMUSCULAR | Status: AC
  Filled 2019-10-06: qty 5

## 2019-10-06 MED ORDER — ONDANSETRON HCL 4 MG/2ML IJ SOLN
INTRAMUSCULAR | Status: DC | PRN
  Administered 2019-10-06: 4 mg via INTRAVENOUS

## 2019-10-06 MED ORDER — CEFAZOLIN SODIUM-DEXTROSE 2-4 GM/100ML-% IV SOLN
2.0000 g | INTRAVENOUS | Status: AC
  Administered 2019-10-06: 2 g via INTRAVENOUS

## 2019-10-06 MED ORDER — MIDAZOLAM HCL 5 MG/5ML IJ SOLN
INTRAMUSCULAR | Status: DC | PRN
  Administered 2019-10-06: 2 mg via INTRAVENOUS

## 2019-10-06 MED ORDER — VASOPRESSIN 20 UNIT/ML IV SOLN
INTRAVENOUS | Status: DC | PRN
  Administered 2019-10-06: 20 mL via INTRAMUSCULAR
  Administered 2019-10-06: 23 mL via INTRAMUSCULAR

## 2019-10-06 MED ORDER — HEMOSTATIC AGENTS (NO CHARGE) OPTIME
TOPICAL | Status: DC | PRN
Start: 2019-10-06 — End: 2019-10-06
  Administered 2019-10-06 (×3): 1 via TOPICAL

## 2019-10-06 MED ORDER — PROMETHAZINE HCL 25 MG/ML IJ SOLN: 6.2500 mg | INTRAMUSCULAR | Status: DC | PRN

## 2019-10-06 MED ORDER — MENTHOL 3 MG MT LOZG: 1.0000 | LOZENGE | OROMUCOSAL | Status: DC | PRN

## 2019-10-06 MED ORDER — ONDANSETRON HCL 4 MG/2ML IJ SOLN: 4.0000 mg | Freq: Four times a day (QID) | INTRAMUSCULAR | Status: DC | PRN

## 2019-10-06 MED ORDER — CEFAZOLIN SODIUM-DEXTROSE 2-4 GM/100ML-% IV SOLN
INTRAVENOUS | Status: AC
  Filled 2019-10-06: qty 100

## 2019-10-06 MED ORDER — LIDOCAINE 2% (20 MG/ML) 5 ML SYRINGE
INTRAMUSCULAR | Status: AC
  Filled 2019-10-06: qty 10

## 2019-10-06 MED ORDER — KETOROLAC TROMETHAMINE 30 MG/ML IJ SOLN
30.0000 mg | Freq: Four times a day (QID) | INTRAMUSCULAR | Status: AC
  Administered 2019-10-06 – 2019-10-07 (×4): 30 mg via INTRAVENOUS
  Filled 2019-10-06 (×4): qty 1

## 2019-10-06 MED ORDER — PROPOFOL 10 MG/ML IV BOLUS
INTRAVENOUS | Status: AC
  Filled 2019-10-06: qty 20

## 2019-10-06 MED ORDER — LACTATED RINGERS IV SOLN: INTRAVENOUS | Status: DC

## 2019-10-06 MED ORDER — DEXTROSE IN LACTATED RINGERS 5 % IV SOLN: INTRAVENOUS | Status: DC

## 2019-10-06 MED ORDER — BUPIVACAINE HCL (PF) 0.25 % IJ SOLN
INTRAMUSCULAR | Status: DC | PRN
  Administered 2019-10-06: 10 mL

## 2019-10-06 MED ORDER — LIDOCAINE 2% (20 MG/ML) 5 ML SYRINGE
INTRAMUSCULAR | Status: DC | PRN
  Administered 2019-10-06: 60 mg via INTRAVENOUS

## 2019-10-06 MED ORDER — DEXAMETHASONE SODIUM PHOSPHATE 10 MG/ML IJ SOLN
INTRAMUSCULAR | Status: AC
  Filled 2019-10-06: qty 2

## 2019-10-06 MED ORDER — HYDROMORPHONE 1 MG/ML IV SOLN
INTRAVENOUS | Status: DC
  Administered 2019-10-06: 30 mg via INTRAVENOUS
  Administered 2019-10-06: 0 mg via INTRAVENOUS

## 2019-10-06 MED ORDER — DEXAMETHASONE SODIUM PHOSPHATE 10 MG/ML IJ SOLN
INTRAMUSCULAR | Status: DC | PRN
  Administered 2019-10-06: 8 mg via INTRAVENOUS

## 2019-10-06 MED ORDER — CHLORHEXIDINE GLUCONATE 0.12 % MT SOLN
OROMUCOSAL | Status: AC
  Administered 2019-10-06: 15 mL via OROMUCOSAL
  Filled 2019-10-06: qty 15

## 2019-10-06 MED ORDER — MIDAZOLAM HCL 2 MG/2ML IJ SOLN
INTRAMUSCULAR | Status: AC
  Filled 2019-10-06: qty 2

## 2019-10-06 MED ORDER — KETOROLAC TROMETHAMINE 30 MG/ML IJ SOLN
INTRAMUSCULAR | Status: DC | PRN
Start: 2019-10-06 — End: 2019-10-06
  Administered 2019-10-06: 30 mg via INTRAVENOUS

## 2019-10-06 MED ORDER — PROPOFOL 10 MG/ML IV BOLUS
INTRAVENOUS | Status: DC | PRN
  Administered 2019-10-06: 120 mg via INTRAVENOUS

## 2019-10-06 MED ORDER — SCOPOLAMINE 1 MG/3DAYS TD PT72: 1.0000 | MEDICATED_PATCH | Freq: Once | TRANSDERMAL | Status: DC

## 2019-10-06 MED ORDER — PHENYLEPHRINE HCL-NACL 10-0.9 MG/250ML-% IV SOLN
INTRAVENOUS | Status: DC | PRN
  Administered 2019-10-06: 30 ug/min via INTRAVENOUS

## 2019-10-06 MED ORDER — KETAMINE HCL 10 MG/ML IJ SOLN
INTRAMUSCULAR | Status: DC | PRN
  Administered 2019-10-06 (×4): 10 mg via INTRAVENOUS

## 2019-10-06 MED ORDER — ORAL CARE MOUTH RINSE: 15.0000 mL | Freq: Once | OROMUCOSAL | Status: AC

## 2019-10-06 MED ORDER — DIPHENHYDRAMINE HCL 12.5 MG/5ML PO ELIX: 12.5000 mg | ORAL_SOLUTION | Freq: Four times a day (QID) | ORAL | Status: DC | PRN

## 2019-10-06 MED ORDER — ONDANSETRON HCL 4 MG PO TABS: 4.0000 mg | ORAL_TABLET | Freq: Four times a day (QID) | ORAL | Status: DC | PRN

## 2019-10-06 MED ORDER — CHLORHEXIDINE GLUCONATE 0.12 % MT SOLN: 15.0000 mL | Freq: Once | OROMUCOSAL | Status: AC

## 2019-10-06 MED ORDER — ROCURONIUM BROMIDE 10 MG/ML (PF) SYRINGE
PREFILLED_SYRINGE | INTRAVENOUS | Status: AC
  Filled 2019-10-06: qty 10

## 2019-10-06 MED ORDER — OXYCODONE HCL 5 MG PO TABS: 5.0000 mg | ORAL_TABLET | ORAL | Status: DC | PRN

## 2019-10-06 MED ORDER — SIMETHICONE 80 MG PO CHEW: 80.0000 mg | CHEWABLE_TABLET | Freq: Four times a day (QID) | ORAL | Status: DC | PRN

## 2019-10-06 MED ORDER — ROCURONIUM BROMIDE 10 MG/ML (PF) SYRINGE
PREFILLED_SYRINGE | INTRAVENOUS | Status: DC | PRN
  Administered 2019-10-06: 60 mg via INTRAVENOUS

## 2019-10-06 MED ORDER — PANTOPRAZOLE SODIUM 40 MG PO TBEC
40.0000 mg | DELAYED_RELEASE_TABLET | Freq: Every day | ORAL | Status: DC
  Administered 2019-10-07: 40 mg via ORAL
  Filled 2019-10-06: qty 1

## 2019-10-06 MED ORDER — ACETAMINOPHEN 500 MG PO TABS: 1000.0000 mg | ORAL_TABLET | Freq: Once | ORAL | Status: AC

## 2019-10-06 MED ORDER — BUPIVACAINE HCL (PF) 0.25 % IJ SOLN
INTRAMUSCULAR | Status: AC
  Filled 2019-10-06: qty 30

## 2019-10-06 SURGICAL SUPPLY — 49 items
ADH SKN CLS APL DERMABOND .7 (GAUZE/BANDAGES/DRESSINGS) ×1
BARRIER ADHS 3X4 INTERCEED (GAUZE/BANDAGES/DRESSINGS) ×5 IMPLANT
BRR ADH 4X3 ABS CNTRL BYND (GAUZE/BANDAGES/DRESSINGS) ×2
CANISTER SUCT 3000ML PPV (MISCELLANEOUS) ×3 IMPLANT
CONT SPEC PATH 64OZ SNAP LID (MISCELLANEOUS) ×3 IMPLANT
DECANTER SPIKE VIAL GLASS SM (MISCELLANEOUS) ×3 IMPLANT
DERMABOND ADVANCED (GAUZE/BANDAGES/DRESSINGS) ×2
DERMABOND ADVANCED .7 DNX12 (GAUZE/BANDAGES/DRESSINGS) IMPLANT
DRAPE CESAREAN BIRTH W POUCH (DRAPES) ×3 IMPLANT
DRSG OPSITE POSTOP 4X10 (GAUZE/BANDAGES/DRESSINGS) ×3 IMPLANT
DURAPREP 26ML APPLICATOR (WOUND CARE) ×3 IMPLANT
ELECT NDL TIP 2.8 STRL (NEEDLE) ×1 IMPLANT
ELECT NEEDLE TIP 2.8 STRL (NEEDLE) ×3 IMPLANT
GAUZE 4X4 16PLY RFD (DISPOSABLE) IMPLANT
GAUZE SPONGE 4X4 12PLY STRL LF (GAUZE/BANDAGES/DRESSINGS) ×3 IMPLANT
GLOVE BIOGEL PI IND STRL 7.0 (GLOVE) ×3 IMPLANT
GLOVE BIOGEL PI INDICATOR 7.0 (GLOVE) ×6
GLOVE ECLIPSE 6.5 STRL STRAW (GLOVE) ×3 IMPLANT
GOWN STRL REUS W/ TWL LRG LVL3 (GOWN DISPOSABLE) ×3 IMPLANT
GOWN STRL REUS W/TWL LRG LVL3 (GOWN DISPOSABLE) ×9
KIT TURNOVER KIT B (KITS) ×3 IMPLANT
NDL SPNL 22GX3.5 QUINCKE BK (NEEDLE) ×1 IMPLANT
NEEDLE HYPO 22GX1.5 SAFETY (NEEDLE) ×3 IMPLANT
NEEDLE SPNL 22GX3.5 QUINCKE BK (NEEDLE) ×3 IMPLANT
NS IRRIG 1000ML POUR BTL (IV SOLUTION) ×3 IMPLANT
PACK ABDOMINAL GYN (CUSTOM PROCEDURE TRAY) ×3 IMPLANT
PAD ARMBOARD 7.5X6 YLW CONV (MISCELLANEOUS) ×3 IMPLANT
PAD OB MATERNITY 4.3X12.25 (PERSONAL CARE ITEMS) ×3 IMPLANT
PENCIL SMOKE EVACUATOR (MISCELLANEOUS) ×3 IMPLANT
RTRCTR C-SECT PINK 25CM LRG (MISCELLANEOUS) ×3 IMPLANT
SPECIMEN JAR MEDIUM (MISCELLANEOUS) ×3 IMPLANT
STAPLER VISISTAT 35W (STAPLE) IMPLANT
SUT MON AB 2-0 SH 27 (SUTURE)
SUT MON AB 2-0 SH27 (SUTURE) IMPLANT
SUT MON AB 3-0 SH 27 (SUTURE) ×15
SUT MON AB 3-0 SH27 (SUTURE) ×3 IMPLANT
SUT MON AB 4-0 PS1 27 (SUTURE) ×3 IMPLANT
SUT PLAIN 2 0 XLH (SUTURE) IMPLANT
SUT VIC AB 0 CT1 18XCR BRD8 (SUTURE) ×1 IMPLANT
SUT VIC AB 0 CT1 36 (SUTURE) ×6 IMPLANT
SUT VIC AB 0 CT1 8-18 (SUTURE) ×3
SUT VIC AB 2-0 CT1 27 (SUTURE) ×3
SUT VIC AB 2-0 CT1 TAPERPNT 27 (SUTURE) ×1 IMPLANT
SUT VIC AB 2-0 SH 27 (SUTURE)
SUT VIC AB 2-0 SH 27XBRD (SUTURE) IMPLANT
SUT VICRYL 4-0 PS2 18IN ABS (SUTURE) IMPLANT
SYR CONTROL 10ML LL (SYRINGE) ×6 IMPLANT
TOWEL GREEN STERILE FF (TOWEL DISPOSABLE) ×6 IMPLANT
TRAY FOLEY W/BAG SLVR 14FR (SET/KITS/TRAYS/PACK) ×3 IMPLANT

## 2019-10-06 NOTE — Anesthesia Procedure Notes (Signed)
Procedure Name: Intubation Date/Time: 10/06/2019 11:04 AM Performed by: Griffin Dakin, CRNA Pre-anesthesia Checklist: Patient identified, Emergency Drugs available, Suction available and Patient being monitored Patient Re-evaluated:Patient Re-evaluated prior to induction Oxygen Delivery Method: Circle system utilized Preoxygenation: Pre-oxygenation with 100% oxygen Induction Type: IV induction Ventilation: Mask ventilation without difficulty Laryngoscope Size: Mac and 3 Grade View: Grade I Tube type: Oral Number of attempts: 1 Airway Equipment and Method: Stylet Placement Confirmation: ETT inserted through vocal cords under direct vision,  positive ETCO2 and breath sounds checked- equal and bilateral Secured at: 23 cm Tube secured with: Tape Dental Injury: Teeth and Oropharynx as per pre-operative assessment

## 2019-10-06 NOTE — Brief Op Note (Signed)
10/06/2019  1:08 PM  PATIENT:  Paige Barber  39 y.o. female  PRE-OPERATIVE DIAGNOSIS:  Fibroid uterus, IDA  POST-OPERATIVE DIAGNOSIS:  SS/IM/SM fibroids, IDA  PROCEDURE:  exp laparotomy, multiple myomectomy  SURGEON:  Surgeon(s) and Role:    * Servando Salina, MD - Primary  PHYSICIAN ASSISTANT:   ASSISTANTS: Gaylord Shih, RNFA   ANESTHESIA:   general FINDINGS: multiple uterine fibroids( IM/SS/SM fibroids), nl tubes and ovaries, nl liver edge EBL:  100 mL   BLOOD ADMINISTERED:none  DRAINS: none   LOCAL MEDICATIONS USED:  MARCAINE     SPECIMEN:  Source of Specimen:  myoma x 25  DISPOSITION OF SPECIMEN:  PATHOLOGY  COUNTS:  YES  TOURNIQUET:  * No tourniquets in log *  DICTATION: .Other Dictation: Dictation Number   [id number]  PLAN OF CARE: Admit to inpatient   PATIENT DISPOSITION:  PACU - hemodynamically stable.   Delay start of Pharmacological VTE agent (>24hrs) due to surgical blood loss or risk of bleeding: no

## 2019-10-06 NOTE — Anesthesia Postprocedure Evaluation (Signed)
Anesthesia Post Note  Patient: Yeny Schmoll  Procedure(s) Performed: Exploratory Laparotomy MYOMECTOMY (N/A )     Patient location during evaluation: PACU Anesthesia Type: General Level of consciousness: awake and alert Pain management: pain level controlled Vital Signs Assessment: post-procedure vital signs reviewed and stable Respiratory status: spontaneous breathing, nonlabored ventilation, respiratory function stable and patient connected to nasal cannula oxygen Cardiovascular status: blood pressure returned to baseline and stable Postop Assessment: no apparent nausea or vomiting Anesthetic complications: no   No complications documented.  Last Vitals:  Vitals:   10/06/19 1430 10/06/19 1449  BP:  112/75  Pulse:  77  Resp:  18  Temp: 36.7 C 36.8 C  SpO2:  100%    Last Pain:  Vitals:   10/06/19 1449  TempSrc: Oral  PainSc:                  Effie Berkshire

## 2019-10-06 NOTE — Anesthesia Preprocedure Evaluation (Addendum)
Anesthesia Evaluation  Patient identified by MRN, date of birth, ID band Patient awake    Reviewed: Allergy & Precautions, NPO status , Patient's Chart, lab work & pertinent test results  Airway Mallampati: II  TM Distance: >3 FB Neck ROM: Full    Dental  (+) Teeth Intact, Dental Advisory Given   Pulmonary asthma ,    breath sounds clear to auscultation       Cardiovascular negative cardio ROS   Rhythm:Regular Rate:Normal     Neuro/Psych negative neurological ROS  negative psych ROS   GI/Hepatic negative GI ROS, Neg liver ROS,   Endo/Other  negative endocrine ROS  Renal/GU negative Renal ROS     Musculoskeletal negative musculoskeletal ROS (+)   Abdominal Normal abdominal exam  (+)   Peds  Hematology negative hematology ROS (+)   Anesthesia Other Findings   Reproductive/Obstetrics                           Anesthesia Physical Anesthesia Plan  ASA: II  Anesthesia Plan: General   Post-op Pain Management:    Induction: Intravenous  PONV Risk Score and Plan: 4 or greater and Treatment may vary due to age or medical condition, Ondansetron, Dexamethasone, Midazolam and Scopolamine patch - Pre-op  Airway Management Planned: Oral ETT  Additional Equipment: None  Intra-op Plan:   Post-operative Plan: Extubation in OR  Informed Consent: I have reviewed the patients History and Physical, chart, labs and discussed the procedure including the risks, benefits and alternatives for the proposed anesthesia with the patient or authorized representative who has indicated his/her understanding and acceptance.     Dental advisory given  Plan Discussed with: CRNA  Anesthesia Plan Comments: (- 2 IV's - Lidocaine gtt, Ketamine )       Anesthesia Quick Evaluation

## 2019-10-06 NOTE — Transfer of Care (Signed)
Immediate Anesthesia Transfer of Care Note  Patient: Paige Barber  Procedure(s) Performed: Exploratory Laparotomy MYOMECTOMY (N/A )  Patient Location: PACU  Anesthesia Type:General  Level of Consciousness: drowsy  Airway & Oxygen Therapy: Patient Spontanous Breathing and Patient connected to face mask oxygen  Post-op Assessment: Report given to RN and Post -op Vital signs reviewed and stable  Post vital signs: Reviewed and stable  Last Vitals:  Vitals Value Taken Time  BP 120/76 10/06/19 1330  Temp    Pulse 78 10/06/19 1332  Resp 18 10/06/19 1332  SpO2 100 % 10/06/19 1332  Vitals shown include unvalidated device data.  Last Pain:  Vitals:   10/06/19 1008  TempSrc:   PainSc: 0-No pain      Patients Stated Pain Goal: 3 (24/93/24 1991)  Complications: No complications documented.

## 2019-10-06 NOTE — Progress Notes (Signed)
Pt admitted to 6N30 from PACU in bed, alert, oriented and shown PCA for pain management.  Will instruct on IS, C, DB. Family to come to bedside. Honeycomb dressing to lower abdomen (transverse).

## 2019-10-07 ENCOUNTER — Encounter (HOSPITAL_COMMUNITY): Payer: Self-pay | Admitting: Obstetrics and Gynecology

## 2019-10-07 LAB — CBC
HCT: 29.2 % — ABNORMAL LOW (ref 36.0–46.0)
Hemoglobin: 9.6 g/dL — ABNORMAL LOW (ref 12.0–15.0)
MCH: 28.5 pg (ref 26.0–34.0)
MCHC: 32.9 g/dL (ref 30.0–36.0)
MCV: 86.6 fL (ref 80.0–100.0)
Platelets: 153 10*3/uL (ref 150–400)
RBC: 3.37 MIL/uL — ABNORMAL LOW (ref 3.87–5.11)
RDW: 15.5 % (ref 11.5–15.5)
WBC: 9.7 10*3/uL (ref 4.0–10.5)
nRBC: 0 % (ref 0.0–0.2)

## 2019-10-07 MED ORDER — IBUPROFEN 800 MG PO TABS: 800.0000 mg | ORAL_TABLET | Freq: Four times a day (QID) | ORAL | 11 refills | Status: AC | PRN

## 2019-10-07 MED ORDER — OXYCODONE HCL 5 MG PO TABS: 5.0000 mg | ORAL_TABLET | ORAL | 0 refills | Status: AC | PRN

## 2019-10-07 NOTE — Op Note (Signed)
NAMEWylodean, Shimmel [facility] MEDICAL RECORD [id number] ACCOUNT [id number] DATE OF BIRTH:08-14-1980 FACILITY: [facility] LOCATION: [facility] PHYSICIAN:Gurtaj Ruz A. Jonisha Kindig, MD  OPERATIVE REPORT  DATE OF PROCEDURE:  10/06/2019  PREOPERATIVE DIAGNOSIS:  Fibroid uterus.  PROCEDURES: Exploratory laparotomy,multiple myomectomy.  POSTOPERATIVE DIAGNOSIS:  Intramural submucosal and subserosal fibroids.  ANESTHESIA:  General.  SURGEON:  Servando Salina, MD  ASSISTANT:  Gaylord Shih, RNFA.  DESCRIPTION OF PROCEDURE:  Under adequate general anesthesia, the patient was placed in the supine position.  She was sterilely prepped and draped in the usual fashion.  Indwelling Foley catheter was sterilely placed.  Marcaine 0.25% was injected along  the planned Pfannenstiel skin incision site.  Pfannenstiel skin incision was then made, carried down to the rectus fascia.  The rectus fascia was opened transversely.  Rectus fascia was then bluntly and sharply dissected off the rectus muscle in superior and inferior fashion.  The rectus muscle was split in the midline.  The parietal peritoneum was entered sharply and extended.  The uterus with its multiple fibroids was extended up to the patient's umbilicus.  On entering the abdominal cavity, the uterus was exteriorized and inspected.  Normal tubes and ovaries were noted bilaterally.  Multiple fibroids, the largest of which was anterior midbody was identified.  Several pedunculated fibroids were noted, as well as some other  subserosal and intramural fibroids.  The procedure was started with the anterior where a dilute solution of Pitressin was injected in a vertical fashion.  An incision was then made and that fibroid was enucleated from its base.  An additional smaller  fibroid below that was removed.  Once that was done and through that defect, inspection did not reveal any further fibroids.  The defect was then closed with 0 Vicryl figure-of-eight sutures until the  serosal surface was reached, at which time 3-0 Monocryl suture was placed in a baseball fashion.  On further inspection posteriorly and fundal was a fibroid that appeared to be a bit more a posterior fibroid, at least 5 cm.  Taking care to identify where the round ligament was bilaterally in relations to the fibroid, the serosal fibroid was then injected with dilute solution of Pitressin in a horizontal pattern.  It was an adjacent fibroid of smaller size to that and that fibroid was posterior to the right tube and ovary.  An incision was  made to encompass both that lateral fibroid and the posterior fundal fibroid.  Once that incision was made, several fibroids were then again removed.  The cavity incidentally was opened.  Palpation suggested more fibroids and 2 submucosal fibroids were removed when the cavity was breached on attempting to get a presumed larger fibroids elsewhere.  The roof of the endometrium was closed with 3-0 Vicryl sutures and then the remaining defect was closed with interrupted 0 figure-of-eight  Vicryl sutures until the seroal surface was reached, at which time the serosa was closed with 4-0 Vicryl subcuticular closure in baseball pattern.  The other pedunculated fibroids were removed after injecting a dilute solution of Pitressin and when all palpable fibroids were removed, the abdomen was irrigated and suctioned.  The uterus was returned back to the abdomen, covered with Interceed and the upper abdomen was gently explored.  Normal palpable liver edge, normal palpable kidney.  The procedure was then  terminated.  2-0 Vicryl suture was used to close the parietal peritoneum.  The fascia was closed with a 0 Vicryl x2.  The subcutaneous area was irrigated.  The rectus fascia was closed with 0  Vicryl x2.  The subcutaneous area interrupted 2-0 plain sutures were placed and 4-0 Vicryl subcuticular closure was done.  Steri-Strips and benzoin was placed.  SPECIMEN:  Myomas x 25 sent to  pathology.  ESTIMATED BLOOD LOSS:  100 mL.  INTRAOPERATIVE FLUIDS:  700 mL.   URINE OUTPUT:  300 mL.  COUNTS:  Sponge and instrument counts x2 was correct.  COMPLICATIONS:  None.  DISPOSITION:  The patient tolerated the procedure well and was transferred to recovery room in stable condition.  VN/NUANCE  D:10/06/2019 T:10/07/2019 JOB:[id number]

## 2019-10-07 NOTE — Discharge Summary (Signed)
Physician Discharge Summary  Patient ID: Paige Barber MRN: [id number] DOB/AGE: November 09, 1980 39 y.o.  Admit date: 10/06/2019 Discharge date: 10/07/2019  Admission Diagnoses: Fibroid uterus, IDA  Discharge Diagnoses: IM/SM/SS fibroid, IDA Active Problems:   Uterine fibroid   S/P myomectomy   Discharged Condition: stable  Hospital Course: pt underwent exp lap, myomectomy. Uncomplicated postoperative course  Consults: None  Significant Diagnostic Studies: labs:  CBC Latest Ref Rng & Units 10/07/2019 10/04/2019  WBC 4.0 - 10.5 K/uL 9.7 3.3(L)  Hemoglobin 12.0 - 15.0 g/dL 9.6(L) 12.4  Hematocrit 36 - 46 % 29.2(L) 39.6  Platelets 150 - 400 K/uL 153 231     Treatments: surgery: exp lap, myomectomy  Discharge Exam: Blood pressure 99/63, pulse 77, temperature 98 F (36.7 C), temperature source Oral, resp. rate 18, height 5\' 3"  (1.6 m), weight 52.9 kg, last menstrual period 09/19/2019, SpO2 100 %. General appearance: alert, cooperative, and no distress Resp: clear to auscultation bilaterally Cardio: regular rate and rhythm, S1, S2 normal, no murmur, click, rub or gallop GI: soft, non-tender; bowel sounds normal; no masses,  no organomegaly Pelvic: deferred Extremities: no edema, redness or tenderness in the calves or thighs Incision/Wound: c/d/i  Disposition: Discharge disposition: 01-Home or Self Care       Discharge Instructions     Call MD for:  persistant nausea and vomiting   Complete by: As directed    Call MD for:  redness, tenderness, or signs of infection (pain, swelling, redness, odor or green/yellow discharge around incision site)   Complete by: As directed    Call MD for:  severe uncontrolled pain   Complete by: As directed    Call MD for:  temperature >100.4   Complete by: As directed    Diet general   Complete by: As directed    Discharge instructions   Complete by: As directed    Call if temperature greater than equal to 100.4, nothing per vagina for  4-6 weeks or severe nausea vomiting, increased incisional pain , drainage or redness in the incision site, no straining with bowel movements, showers no bath   Discharge wound care:   Complete by: As directed    Remove honeycomb dressing on Thursday   May walk up steps   Complete by: As directed       Allergies as of 10/07/2019   No Known Allergies      Medication List     TAKE these medications    acetaminophen 500 MG tablet Commonly known as: TYLENOL Take 1,000 mg by mouth every 6 (six) hours as needed for moderate pain or headache.   albuterol 108 (90 Base) MCG/ACT inhaler Commonly known as: VENTOLIN HFA Inhale 2 puffs into the lungs every 6 (six) hours as needed for wheezing or shortness of breath.   dextromethorphan-guaiFENesin 30-600 MG 12hr tablet Commonly known as: MUCINEX DM Take 1 tablet by mouth 2 (two) times daily as needed for cough.   ELDERBERRY ZINC/VIT C/IMMUNE MT Take 1 tablet by mouth daily.   ibuprofen 800 MG tablet Commonly known as: ADVIL Take 1 tablet (800 mg total) by mouth every 6 (six) hours as needed for moderate pain.   Iron 18 MG Tbcr Take 18 mg by mouth daily.   loratadine 10 MG tablet Commonly known as: CLARITIN Take 10 mg by mouth daily as needed for allergies.   Magnesium 400 MG Caps Take 400 mg by mouth daily.   Melatonin 10 MG Caps Take 10 mg by mouth at bedtime as  needed (sleep).   Nasacort Allergy 24HR 55 MCG/ACT Aero nasal inhaler Generic drug: triamcinolone Place 1 spray into the nose daily as needed (allergies).   oxyCODONE 5 MG immediate release tablet Commonly known as: Oxy IR/ROXICODONE Take 1-2 tablets (5-10 mg total) by mouth every 4 (four) hours as needed for moderate pain.               Discharge Care Instructions  (From admission, onward)           Start     Ordered   10/07/19 0000  Discharge wound care:       Comments: Remove honeycomb dressing on Thursday   10/07/19 1526             Follow-up Information     Servando Salina, MD Follow up on 11/09/2019.   Specialty: Obstetrics and Gynecology Why: office will call and give time Contact information: [address] [place] [address] [phone]                 Signed: Alanda Slim A Kayonna Lawniczak 10/07/2019, 3:27 PM

## 2019-10-07 NOTE — Progress Notes (Signed)
Called RN . Pt has passed gas Spoke with pt. She has continued to tolerate reg diet. Has had a small BM and passing gas. She is not febrile and is therefore deemed well to go home Instructed to remove her dressing at home on Thursday. D/c instructions reviewed with her via phone Opportunity to ask questions done and answered. Will have office call her on Monday for appt 7/15

## 2019-10-07 NOTE — Discharge Instructions (Signed)
Call if temperature greater than equal to 100.4, nothing per vagina for 4-6 weeks or severe nausea vomiting, increased incisional pain , drainage or redness in the incision site, no straining with bowel movements, showers no bath °

## 2019-10-07 NOTE — Progress Notes (Signed)
Subjective: Patient reports tolerating PO and no problems voiding.  Reviewed intraop findings with pt. Mother has her pictures  Objective: I have reviewed patient's vital signs.  vital signs, medications, and labs. Vitals:   10/07/19 0059 10/07/19 0502  BP: 100/62 (!) 97/56  Pulse: 70 67  Resp: 17 17  Temp: 98.3 F (36.8 C) 98.3 F (36.8 C)  SpO2: 100% 100%   I/O last 3 completed shifts: In: 480 [P.O.:480] Out: 800 [Urine:700; Blood:100] Total I/O In: 609 [I.V.:609] Out: 700 [Urine:700]  Lab Results  Component Value Date   WBC 9.7 10/07/2019   HGB 9.6 (L) 10/07/2019   HCT 29.2 (L) 10/07/2019   MCV 86.6 10/07/2019   PLT 153 10/07/2019   No results found for: CREATININE  EXAM General: alert, cooperative, and no distress Resp: clear to auscultation bilaterally Cardio: regular rate and rhythm, S1, S2 normal, no murmur, click, rub or gallop GI: incision: clean, dry, and intact and soft active BS non distended Extremities: no edema, redness or tenderness in the calves or thighs Vaginal Bleeding: minimal  Assessment: s/p Procedure(s): Exploratory Laparotomy MYOMECTOMY: stable, tolerating diet, and anemia  Plan: Encourage ambulation Discontinue IV fluids Advised d/c home if pass flatus.   LOS: 1 day    Marvene Staff, MD 10/07/2019 6:36 AM    10/07/2019, 6:36 AM

## 2019-10-09 LAB — SURGICAL PATHOLOGY

## 2020-05-04 ENCOUNTER — Ambulatory Visit: Payer: PRIVATE HEALTH INSURANCE | Attending: Internal Medicine

## 2020-05-04 DIAGNOSIS — Z23 Encounter for immunization: Secondary | ICD-10-CM

## 2020-05-04 NOTE — Progress Notes (Signed)
   Covid-19 Vaccination Clinic  Name:  Paige Barber    MRN: [id number] DOB: August 03, 1980  05/04/2020  [name] was observed post Covid-19 immunization for 15 minutes without incident. She was provided with Vaccine Information Sheet and instruction to access the V-Safe system.   [name] was instructed to call 911 with any severe reactions post vaccine: Marland Kitchen Difficulty breathing  . Swelling of face and throat  . A fast heartbeat  . A bad rash all over body  . Dizziness and weakness   Immunizations Administered    Name Date Dose VIS Date Route   Pfizer COVID-19 Vaccine 05/04/2020  9:53 AM 0.3 mL 02/14/2020 Intramuscular   Manufacturer: [place]   Lot: [id number]   [place]: [id number]
